# Patient Record
Sex: Female | Born: 1965 | Race: White | Hispanic: No | Marital: Single
Health system: Southern US, Community
[De-identification: ages and names within clinical notes are randomized; demographics above are authoritative.]

---

## 2014-02-04 ENCOUNTER — Inpatient Hospital Stay: Payer: Self-pay | Admitting: Psychiatry

## 2014-02-04 LAB — BEHAVIORAL MEDICINE 1 PANEL
ANION GAP: 7 (ref 7–16)
Albumin: 3.4 g/dL (ref 3.4–5.0)
Alkaline Phosphatase: 97 U/L
BASOS ABS: 0.1 10*3/uL (ref 0.0–0.1)
BILIRUBIN TOTAL: 0.2 mg/dL (ref 0.2–1.0)
BUN: 8 mg/dL (ref 7–18)
Basophil %: 0.4 %
CALCIUM: 8.8 mg/dL (ref 8.5–10.1)
CO2: 32 mmol/L (ref 21–32)
Chloride: 101 mmol/L (ref 98–107)
Creatinine: 0.81 mg/dL (ref 0.60–1.30)
EGFR (Non-African Amer.): >60
EOS ABS: 0.3 10*3/uL (ref 0.0–0.7)
Eosinophil %: 2.2 %
Glucose: 98 mg/dL (ref 65–99)
HCT: 41.5 % (ref 35.0–47.0)
HGB: 13.9 g/dL (ref 12.0–16.0)
Lymphocyte #: 3.5 10*3/uL (ref 1.0–3.6)
Lymphocyte %: 28.5 %
MCH: 30.3 pg (ref 26.0–34.0)
MCHC: 33.4 g/dL (ref 32.0–36.0)
MCV: 91 fL (ref 80–100)
MONO ABS: 1.1 x10 3/mm — AB (ref 0.2–0.9)
MONOS PCT: 8.6 %
NEUTROS ABS: 7.5 10*3/uL — AB (ref 1.4–6.5)
Neutrophil %: 60.3 %
OSMOLALITY: 278 (ref 275–301)
PLATELETS: 213 10*3/uL (ref 150–440)
POTASSIUM: 4 mmol/L (ref 3.5–5.1)
RBC: 4.57 10*6/uL (ref 3.80–5.20)
RDW: 13.2 % (ref 11.5–14.5)
SGOT(AST): 16 U/L (ref 15–37)
SGPT (ALT): 21 U/L (ref 12–78)
Sodium: 140 mmol/L (ref 136–145)
Thyroid Stimulating Horm: 2.04 u[IU]/mL
Total Protein: 7.3 g/dL (ref 6.4–8.2)
WBC: 12.4 10*3/uL — AB (ref 3.6–11.0)

## 2014-02-04 LAB — URINALYSIS, COMPLETE
Bilirubin,UR: NEGATIVE
Blood: NEGATIVE
GLUCOSE, UR: NEGATIVE mg/dL (ref 0–75)
KETONE: NEGATIVE
Leukocyte Esterase: NEGATIVE
NITRITE: NEGATIVE
Ph: 7 (ref 4.5–8.0)
Protein: NEGATIVE
RBC,UR: 3 /HPF (ref 0–5)
Specific Gravity: 1.01 (ref 1.003–1.030)
WBC UR: 1 /HPF (ref 0–5)

## 2014-02-10 ENCOUNTER — Ambulatory Visit: Payer: Self-pay | Admitting: Psychiatry

## 2014-02-26 ENCOUNTER — Ambulatory Visit: Payer: Self-pay | Admitting: Psychiatry

## 2014-11-19 NOTE — Discharge Summary (Signed)
PATIENT NAME:  Linda Newton, Linda Newton MR#:  [id number] DATE OF BIRTH:  19-Dec-1965  DATE OF ADMISSION:  02/04/2014 DATE OF DISCHARGE:  02/10/2014  HOSPITAL COURSE: See dictated history and physical for details of admission. This 49 year old woman with a history of severe recurrent depression was brought here to the hospital for ECT evaluation and treatment. The patient was a good candidate for ECT and was counseled about the procedure and gave full consent. ECT was begun at the beginning of this week and she had treatment on July 13 and 15.  Right unilateral treatment.  Tolerated the treatment well both times with only a modest headache that responded well to appropriate pain medicine. The patient has been interactive on the unit, going to groups, and interacting in therapy. Her affect remained somewhat blunted, but she denies any suicidal ideation and describes himself as feeling more optimistic and hopeful. I have been able to decrease the dose of her benzodiazepines by a bit, although we have not stopped the Klonopin completely. The patient will be discharged home now that we have arranged for transportation. We have manage to contract with a [employer] company that can transport her here for ECT and has promised to do so Friday and then Monday, Wednesday, and Friday into next week. The patient understands that I cannot predict how many ECT treatments will be recommended, but we will work with her. We also discussed the possibility of longer term ECT maintenance if that seems indicated and she is open to consider it. Reviewed medicines.  Reviewed treatment plan. Not acutely dangerous. Discharged today.   DISCHARGE MENTAL STATUS EXAM: Neatly dressed and groomed woman, looks her stated age, cooperative with the interview. Good eye contact. Normal psychomotor activity. Mood is stated as a little bit better. Affect is kind of blunted, but not tearful or terribly depressed. Thoughts are lucid, a little bit slow, but no evidence  of psychotic thinking. Denies auditory or visual hallucinations. Alert and oriented x 4. Good judgment and insight. Short and long-term memory intact.   DISCHARGE MEDICATIONS: Losartan 50 mg once a day, Pregabalin 100 mg 3 times a day, trazodone 300 mg at night, venlafaxine extended release 225 mg once a day, loratadine 10 mg once a day, atorvastatin 10 mg once a day, Saphris 5 mg in the morning and 10 mg at night, clonazepam 0.5 mg twice a day with instructions to not take it on the morning of treatment, Zolpidem 5 mg at night, Amlodipine 2.5 mg once a day, hydrochlorothiazide 12.5 mg once a day, Naprosyn 500 mg twice a day as needed for pain, and docusate 100 mg twice a day.   LABORATORY RESULTS: Work-up was done for ECT and included normal liver function tests,  normal electrolytes,  normal glucose.  There was a slightly elevated white count at 12.5, but otherwise the CBC was all normal. Urinalysis was unremarkable. The EKG was read as normal sinus rhythm with a nonspecific ST and T wave abnormality that did not appear to be ischemic. The QT corrected was 457. A head CT was performed, which showed no focal findings, just mild atrophy. The chest x-ray was unremarkable.   DISPOSITION: Discharge home by [employer].  Follow up with outpatient treatment for ECT on an index basis. Also has an ACT team in the community.   DIAGNOSIS, PRINCIPAL AND PRIMARY:  AXIS I: Major depression, severe, recurrent.   SECONDARY DIAGNOSES:  AXIS I: Posttraumatic stress disorder by history.  AXIS II: Deferred.  AXIS III: Awful sclerosis,  hypertension, history of constipation, chronic mild pain, dyslipidemia.  AXIS IV: Moderate from chronic depression.  AXIS V: Functioning at time of discharge 48.    ____________________________ Audery Amel, MD jtc:ts D: 02/10/2014 11:03:38 ET T: 02/10/2014 12:07:03 ET JOB#: [id number]  cc: Audery Amel, MD, <Dictator> Audery Amel MD ELECTRONICALLY SIGNED 02/13/2014 12:42

## 2014-11-19 NOTE — H&P (Signed)
PATIENT NAME:  Linda Newton, Linda Newton MR#:  [id number] DATE OF BIRTH:  12/22/1965  DATE OF ADMISSION:  02/04/2014  IDENTIFYING INFORMATION AND CHIEF COMPLAINT: A 49 year old woman sent here voluntarily for ECT evaluation.   CHIEF COMPLAINT: "I'Newton depressed."   HISTORY OF PRESENT ILLNESS: Information obtained from the patient and from the referral information. [facility]. The patient states that she has had severe depression that has been going on for years. She has been diagnosed with bipolar disorder, PTSD and borderline personality disorder as well. The last time she was manic was probably about 5 years ago. Her current episode of depression has been going on since last summer. She had a period of remission at that time due to ECT, but it quickly wore off after she stopped the ECT treatment. Her mood feels down and depressed and anxious all the time. She has frequent suicidal thoughts with negative thoughts about herself. She has very little to no enjoyment of anything in her life. She sleeps because she takes her medication, but she feels tired during the day. Appetite is fine. Denies any hallucinations or psychotic symptoms. She is functioning poorly outside the hospital. The patient is currently seeing an outpatient psychiatrist locally and is on antidepressants and antipsychotics. Current medication seems to not be working and she has not shown any improvement. She has had multiple hospitalizations recently because of suicidal ideation.   PAST PSYCHIATRIC HISTORY: The patient states that she has had prior manic episodes, but it has been about 5 years since then. Most of the time she stays depressed and down. Medications at times have been temporarily helpful, but the most helpful treatment she has had so far was ECT, which was done last summer  in [place]. The patient does have a history of suicide attempts multiple times in the past. No history of serious violence. No  history of drug abuse. She denies having a history of psychotic symptoms with her illness.   SOCIAL HISTORY: The patient is disabled, lives with her husband. She has 4 children, but none of them live with her. She says her relationship with her husband is good and she has a lot of faith in him. The patient [place] of [place] away from [place] near [place] but states that she would be willing to make the trip to accommodate ECT.   PAST MEDICAL HISTORY: She says she has multiple sclerosis. She claims her current tremor is her most common symptom and she has not had other major symptoms for a while. She is not currently in any treatment for it and has not seen a neurologist since reloc[place] to [place] . The patient has chronic pain for which she takes Lyrica, high blood pressure, dyslipidemia. No history of heart attack. No history of stroke. No history of coronary artery disease. No history of seizure disorder.   SUBSTANCE ABUSE HISTORY: Denies any alcohol or drug abuse currently or in the past.   FAMILY HISTORY: No known history of mental illness.   CURRENT MEDICATIONS: Norvasc 2.5 mg once a day, Saphris 5 mg in the morning and 10 mg at night, atorvastatin 10 mg once a day, clonazepam 0.5 mg twice a day, docusate 100 mg twice a day, hydrochlorothiazide 12.5 mg once a day, Effexor extended release 225 mg in the morning, Ambien 10 mg at night.   ALLERGIES: Multiple intolerances to medicines including ABILIFY, DEPAKOTE, GEODON, LAMICTAL AND SEROQUEL.   REVIEW OF SYSTEMS: Currently reports being  depressed. Mood is down. Has suicidal thoughts without intent to act on it. Denies hallucinations. Feels jittery most of the time. She denies hallucinations. Physically she feels weak and a little shaky, but denies other acute physical symptoms.   MENTAL STATUS EXAMINATION: alert and oriented, cooperative with the interview, mildly disheveled woman who looks her stated age. Good eye contact. Slow  psychomotor activity. Speech is slow, but easy to understand and complete. Affect blunted and dysphoric. Mood stated as depressed. Thoughts are lucid without loosening of associations but a little bit slowed. Denies any auditory or visual hallucinations. Positive suicidal ideation without intent. No homicidal ideation. Alert and oriented x4. Short and long-term memory intact. Normal intelligence. Normal fund of knowledge.   PHYSICAL EXAMINATION: GENERAL: Slightly overweight woman, looks in no acute distress.  SKIN: No skin lesions identified.  HEENT: Pupils equal and reactive. Face symmetric. She has no teeth but oral mucosa looks intact and healthy.  NECK AND BACK: Nontender.  MUSCULOSKELETAL: Full range of motion in the neck. Full rage of motion at all extremities.  NEUROLOGIC: Normal gait. Strength and reflexes normal and symmetric throughout. Cranial nerves symmetric and normal.  LUNGS: Clear without wheezes.  HEART: Regular rate and rhythm.  ABDOMEN: Soft, nontender. Normal bowel sounds. VITAL SIGNS: We do not yet have a set of vital signs that have been taken on this patient. we are waiting on that.   LABORATORY RESULTS: Nothing back yet since she just came into the hospital.   ASSESSMENT: A 49 year old woman with bipolar disorder depressed with an extended period of depression. No psychotic features but ongoing suicidal ideation. She has had failure to respond to multiple medications and failure to tolerate multiple medications. She has had a good response to ECT in the past. She understands the nature of ECT, the side effects involved and is very agreeable, in fact asking to start it again.   TREATMENT PLAN: Continue current medicines. Cut back a little bit on the Ativan that she has been taking. Involve her in groups and activities on the unit as much as possible. Get the full lab tests, the x-ray, the EKG and I am going to get a head CT done. She will meet with the ECT nurse and she will  be given the option to sign consent. If she does we will plan for ECT treatment, right unilateral, to begin on Monday morning.   DIAGNOSIS, PRINCIPAL AND PRIMARY:  AXIS I: Bipolar disorder type I depressed.   SECONDARY DIAGNOSES: AXIS I: No further.  AXIS II: Deferred.  AXIS III: Multiple sclerosis, high blood pressure, dyslipidemia.  AXIS IV: Severe from chronic illness.  AXIS V: Functioning at time of admission 33.   ____________________________ Audery Amel, MD jtc:sb D: 02/04/2014 15:39:37 ET T: 02/04/2014 15:48:32 ET JOB#: [id number]  cc: Audery Amel, MD, <Dictator> Audery Amel MD ELECTRONICALLY SIGNED 02/13/2014 12:41

## 2015-11-18 IMAGING — CR DG CHEST 2V
1 series · 2 of 2 positions shown · non-contrast
Comparison: None.

CLINICAL DATA: Preprocedure evaluation for electroconvulsive
therapy

EXAM:
CHEST  2 VIEW

[Series 1: w chest pa · 0.14mm/px · 2 of 2 slices shown]
[im 1/2]
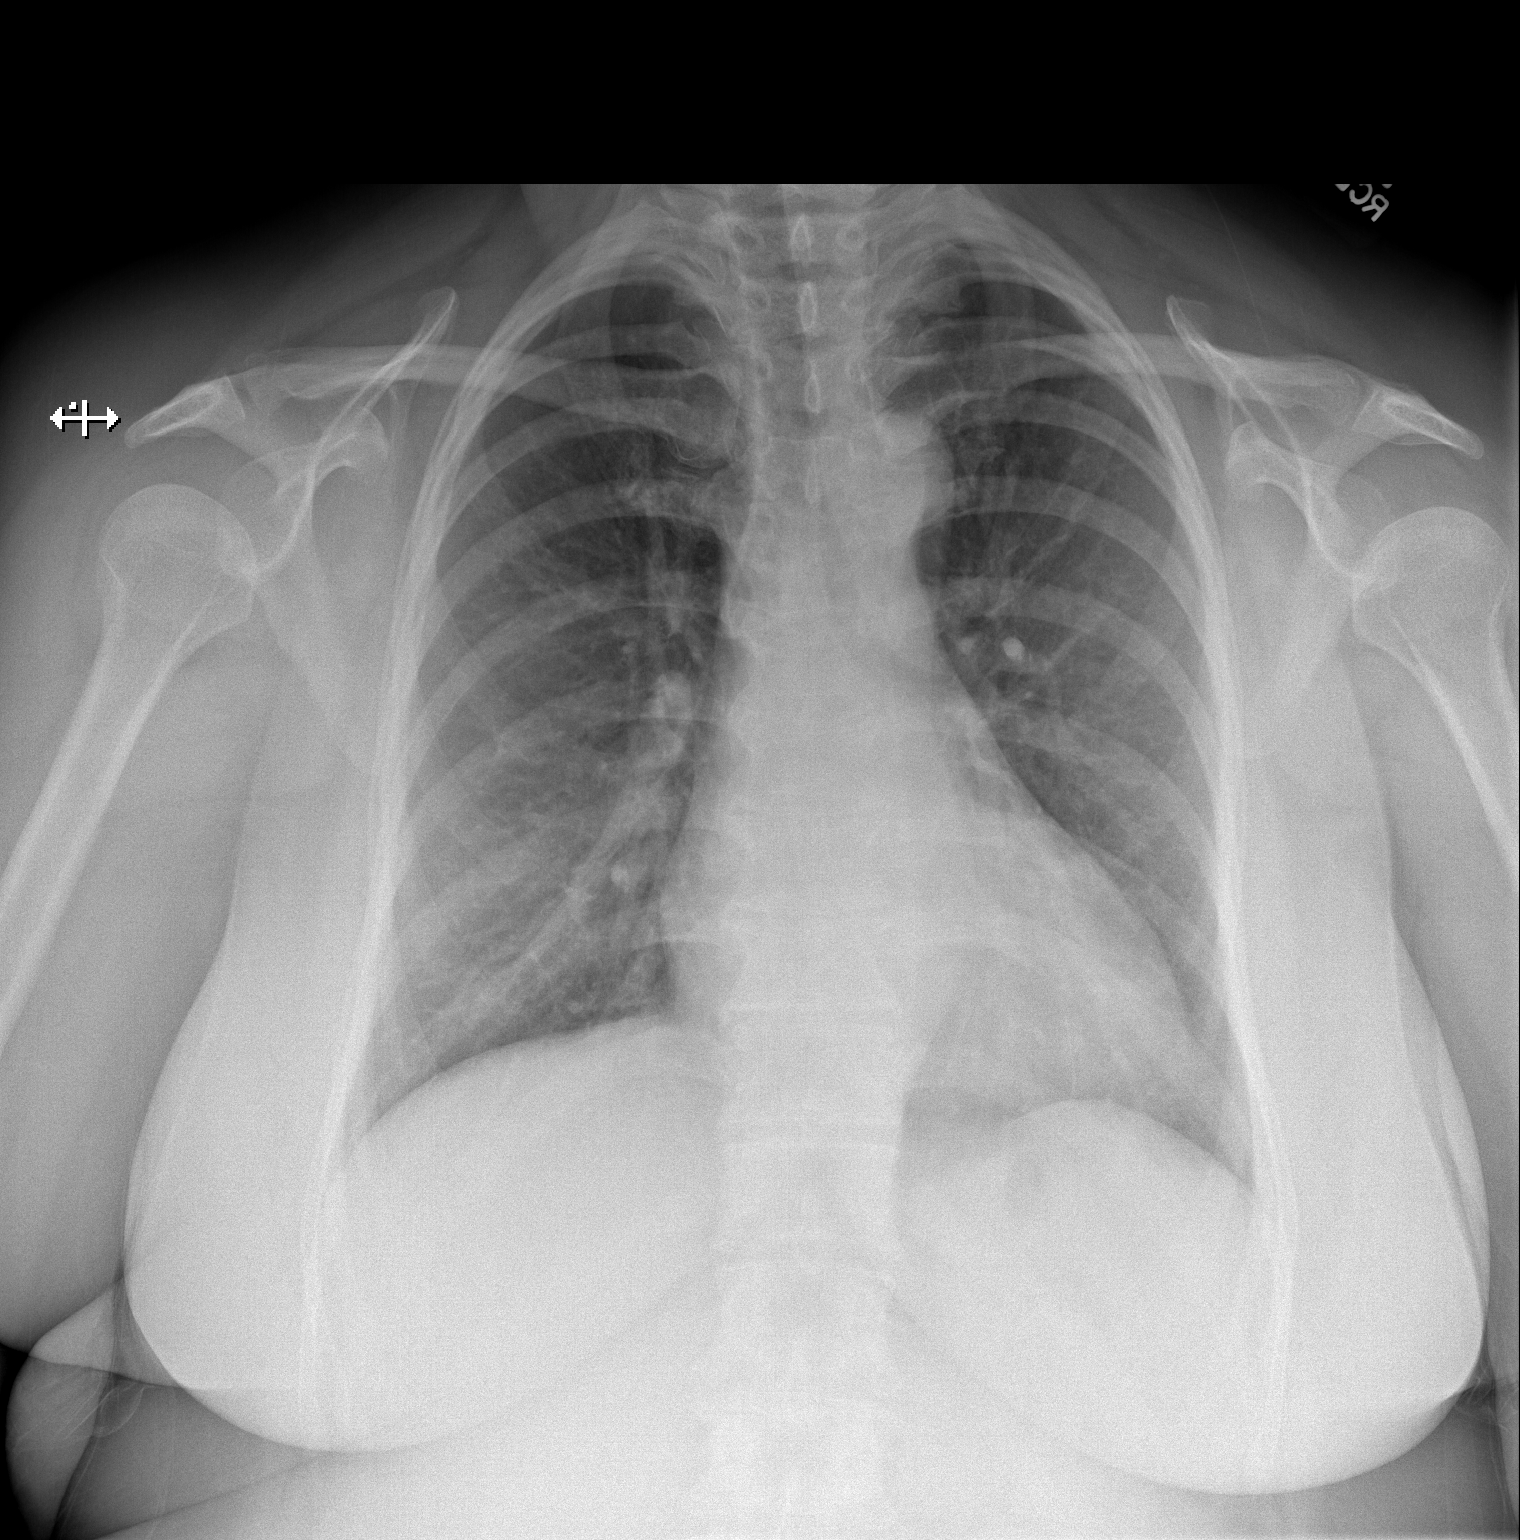
[im 2/2]
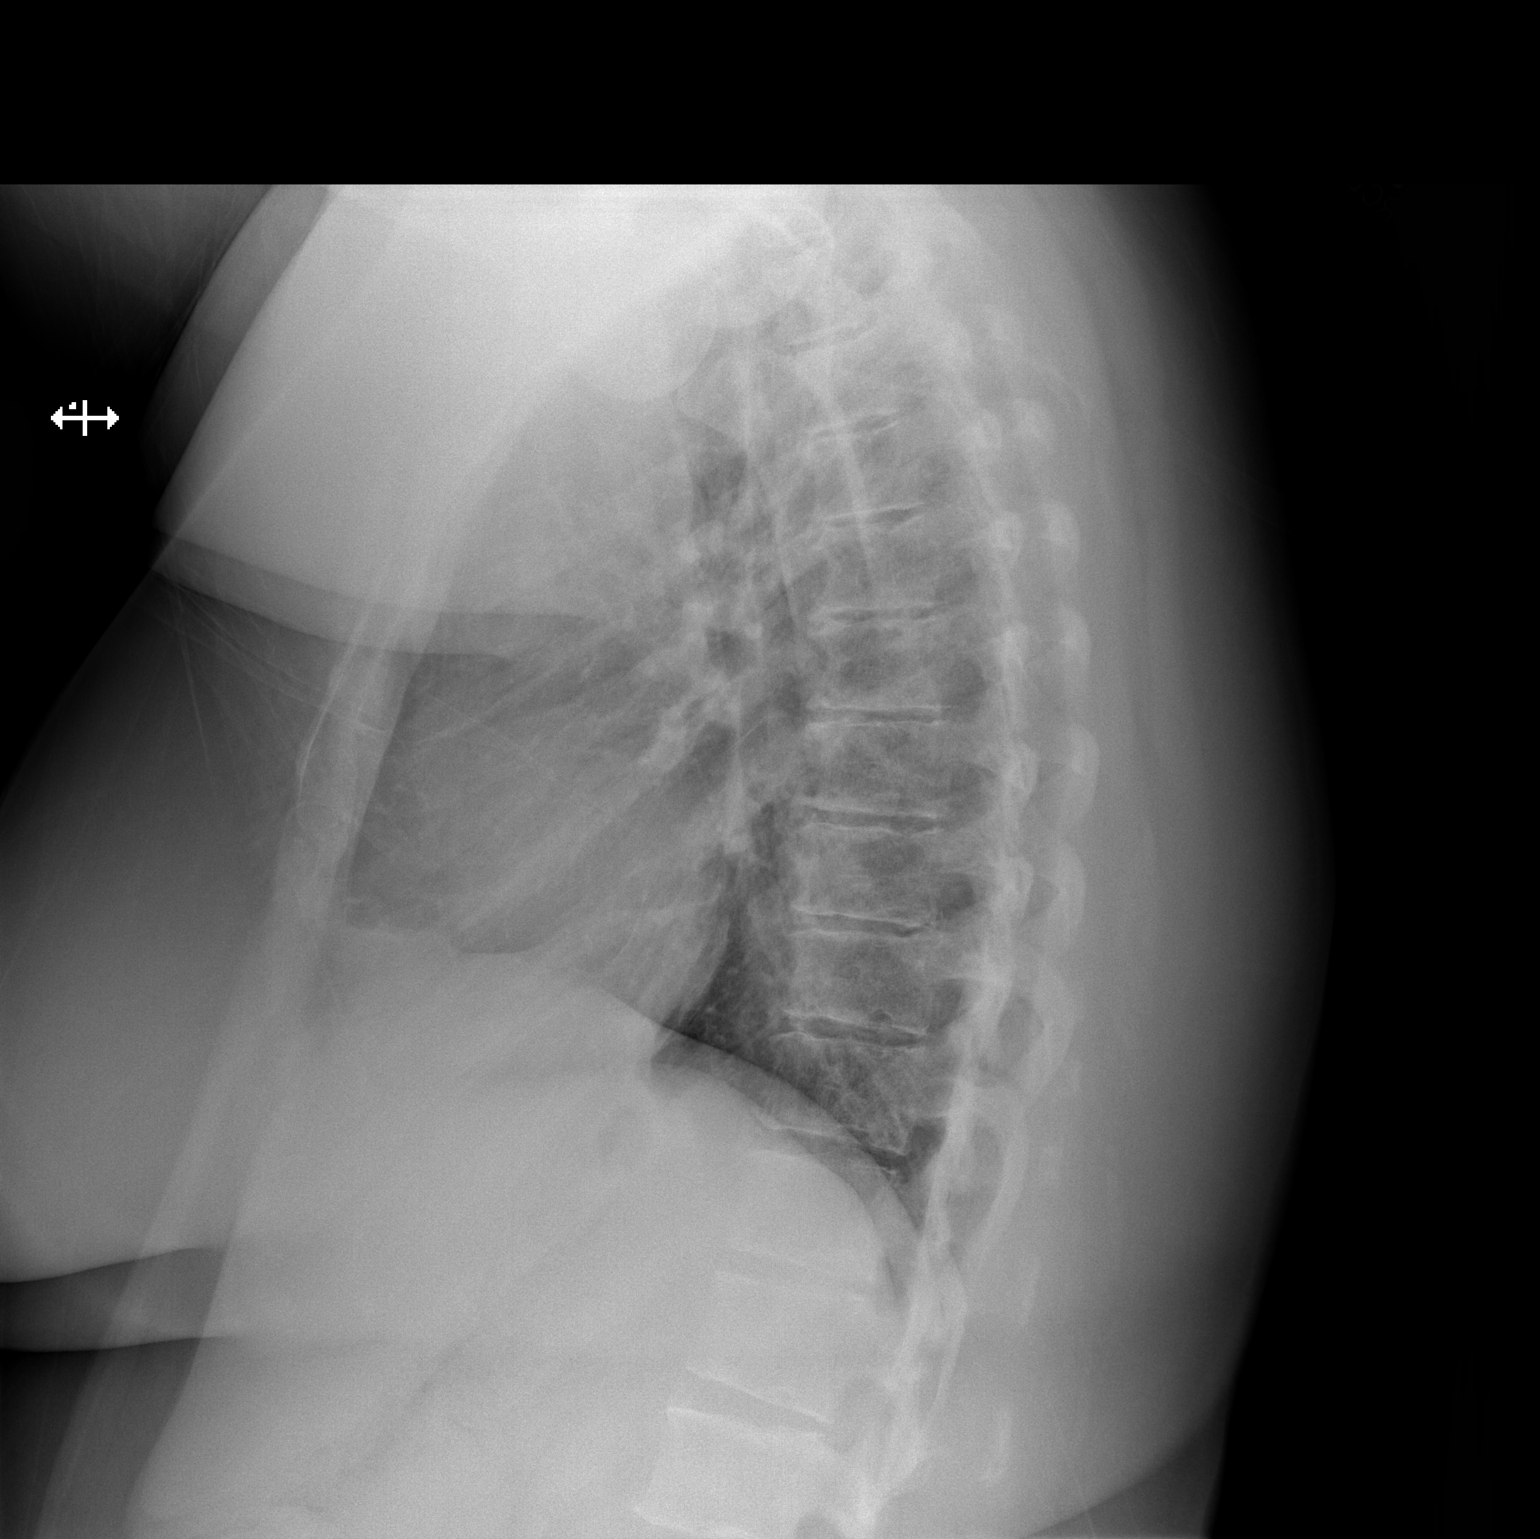

[2 of 2 positions shown; findings below may reference images not displayed]

FINDINGS: Lungs are clear. Heart is upper normal in size with normal pulmonary
vascularity. No adenopathy. No pneumothorax. No bone lesions.
IMPRESSION: No edema or consolidation.

## 2015-11-18 IMAGING — CT CT HEAD WITHOUT CONTRAST
1 series · 16 of 30 positions shown, 20 images · non-contrast
Comparison: None.

CLINICAL DATA: Depression and bipolar disorder. Multiple sclerosis.

EXAM:
CT HEAD WITHOUT CONTRAST
TECHNIQUE: Contiguous axial images were obtained from the base of the skull
through the vertex without intravenous contrast.

[Series 4: head wo · axial · 0.40mm/px · z∈[-160,-30]mm · 16 of 32 slices shown, 20 images]
[im 2/32  brain]
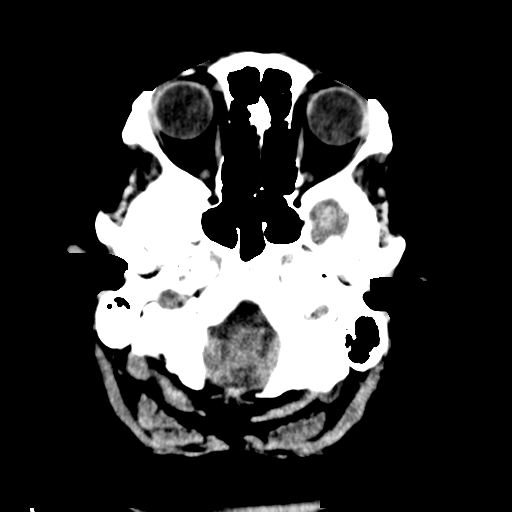
[im 2/32  bone]
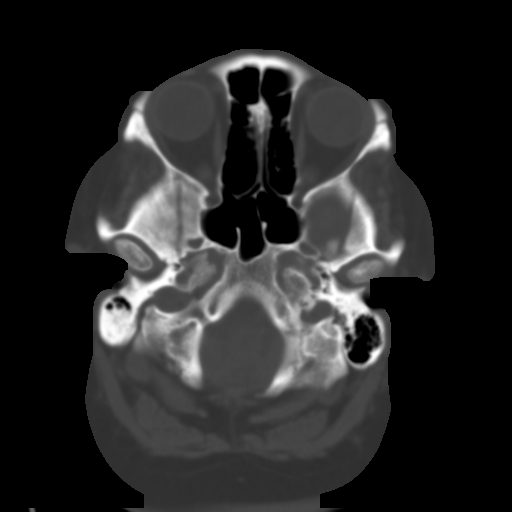
[im 4/32  brain]
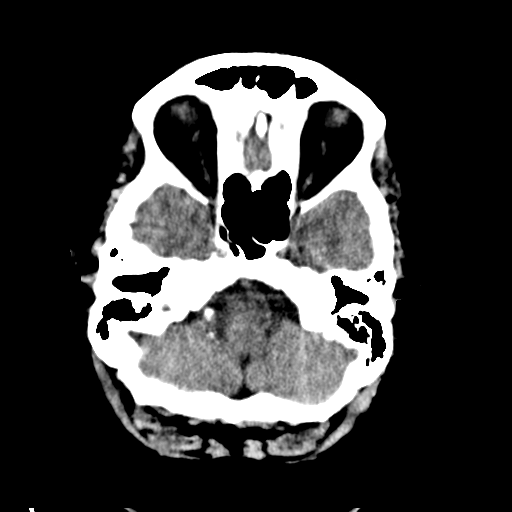
[im 6/32  brain]
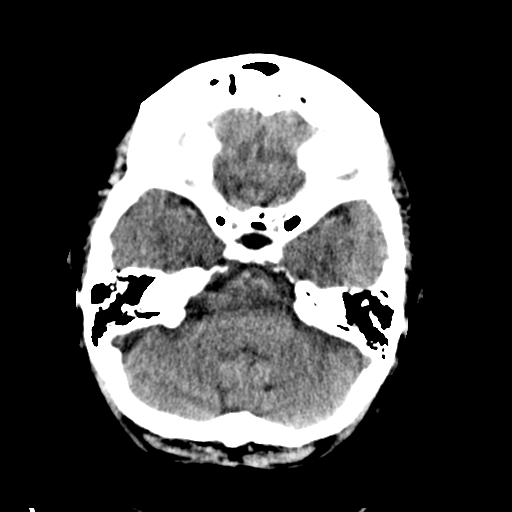
[im 8/32  brain]
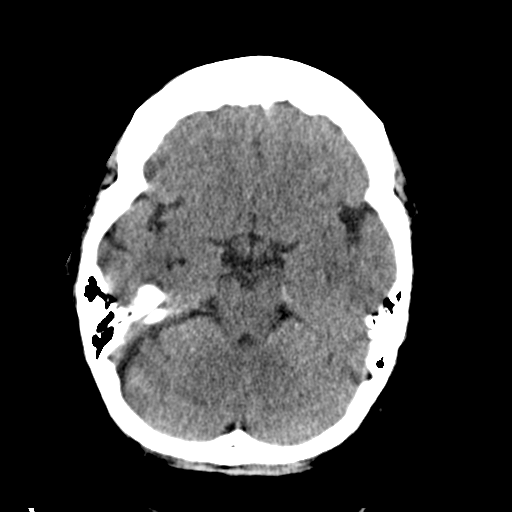
[im 9/32  brain]
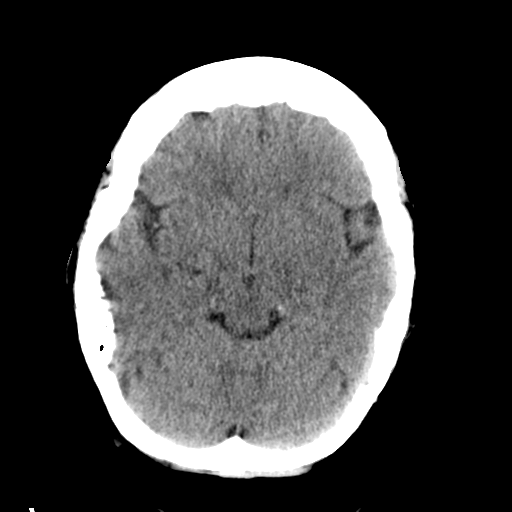
[im 9/32  bone]
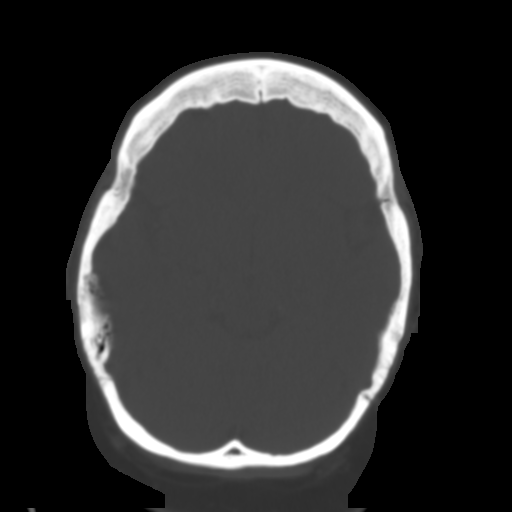
[im 11/32  brain]
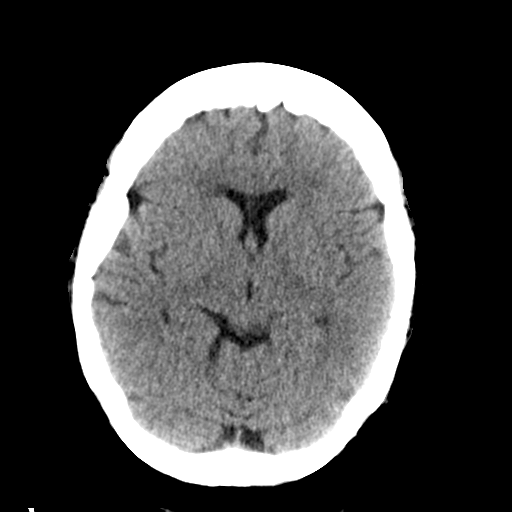
[im 13/32  brain]
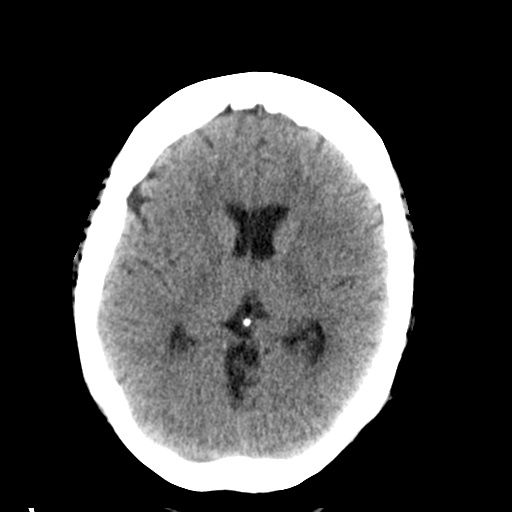
[im 15/32  brain]
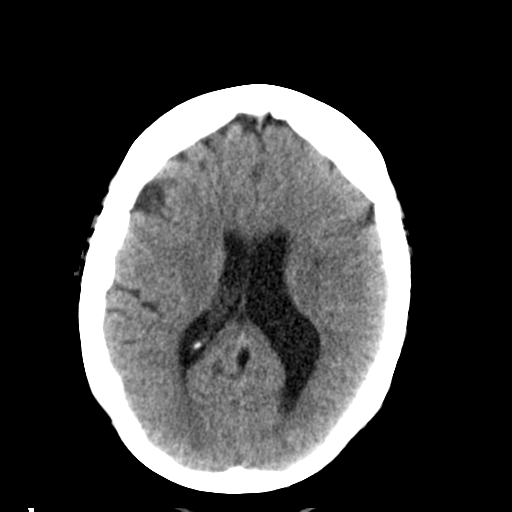
[im 17/32  brain]
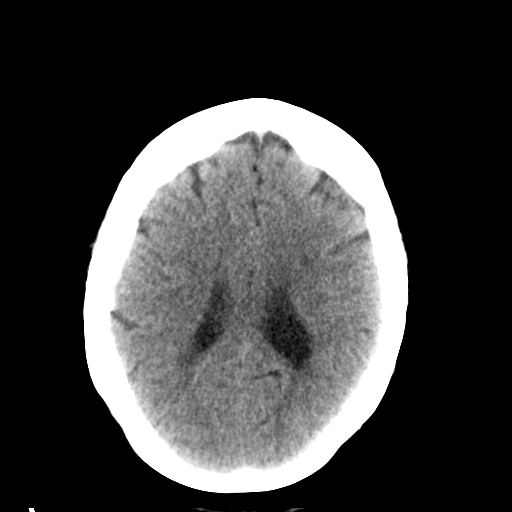
[im 17/32  bone]
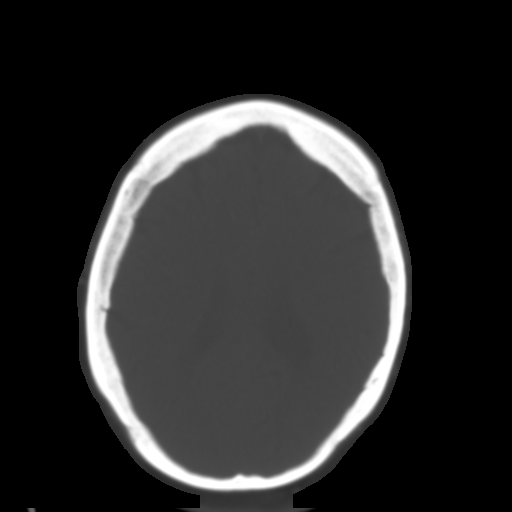
[im 19/32  brain]
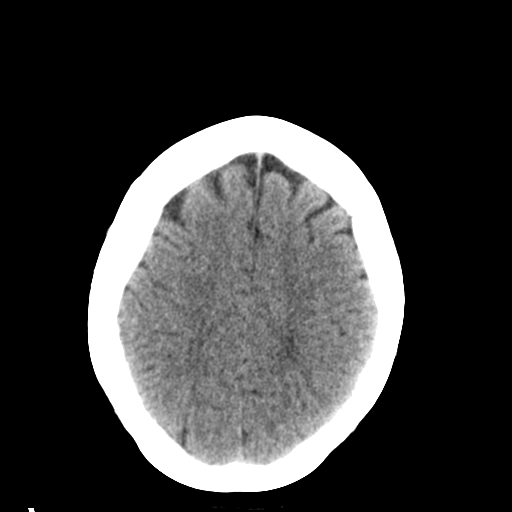
[im 21/32  brain]
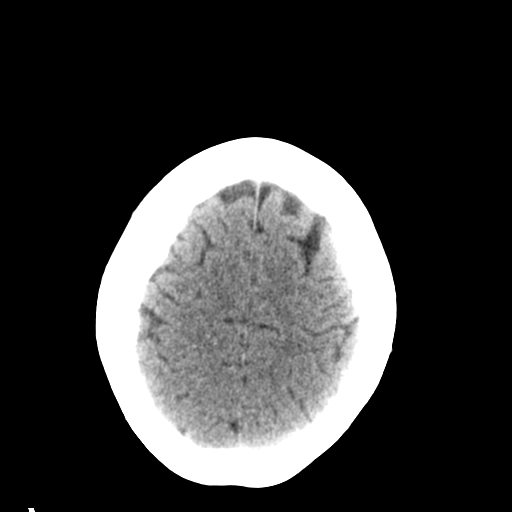
[im 23/32  brain]
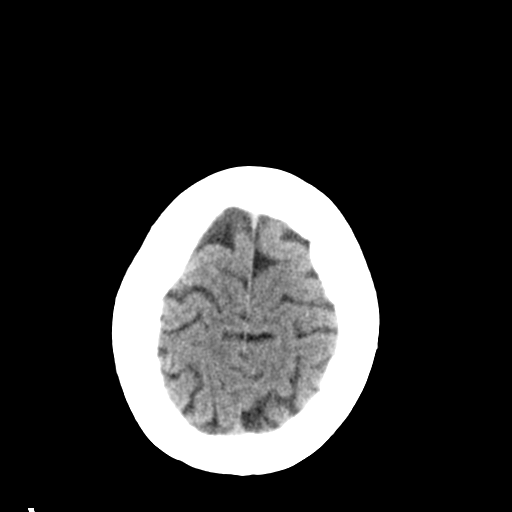
[im 24/32  brain]
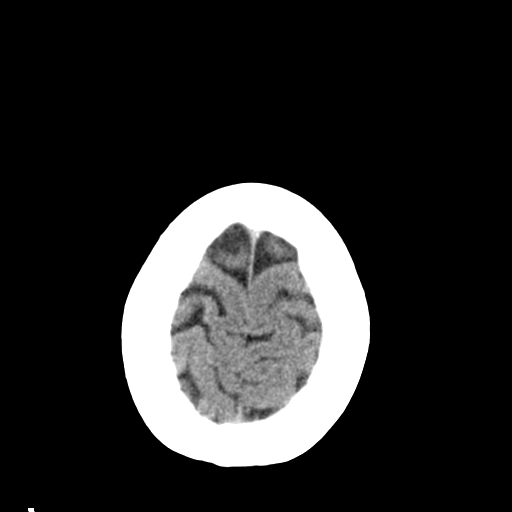
[im 24/32  bone]
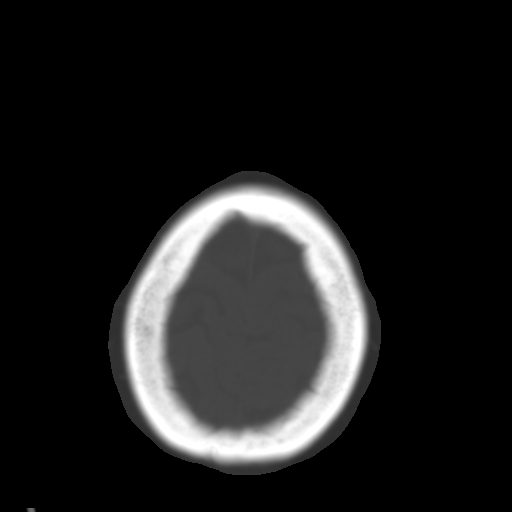
[im 26/32  brain]
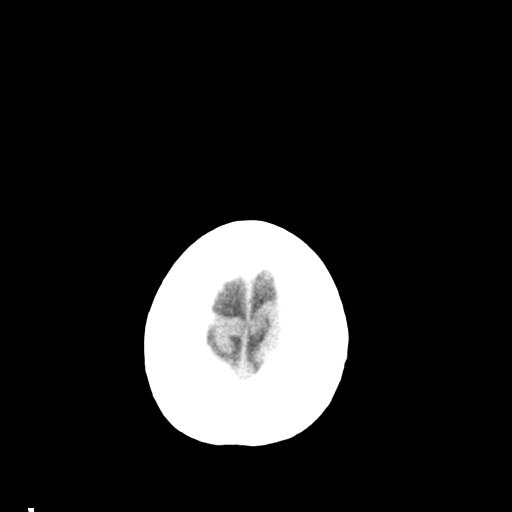
[im 28/32  brain]
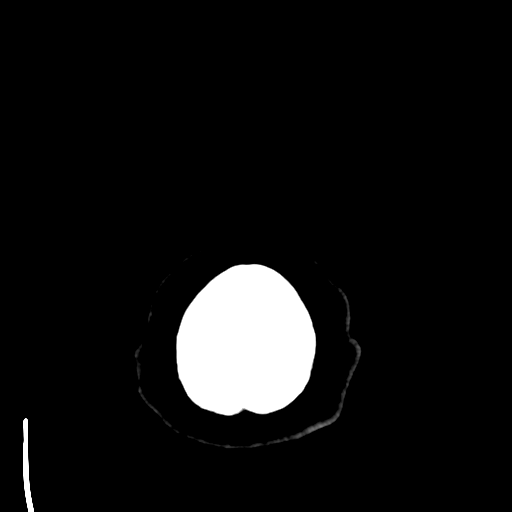
[im 30/32  brain]
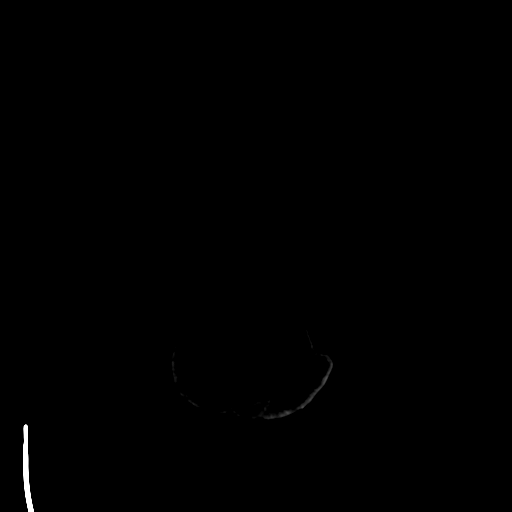

[16 of 30 positions shown; findings below may reference images not displayed]

FINDINGS: There is mild generalized brain atrophy. There are a few areas of
low density in the hemispheric deep white matter consistent with old
small vessel insults. There is no sign of acute infarction, mass
lesion, hemorrhage, hydrocephalus or extra-axial collection. No
significant calvarial abnormality. Visualized sinuses, middle ears
and mastoids are clear.
IMPRESSION: No focal or acute finding. Mild brain atrophy. Few old small vessel
insults within the hemispheric deep white matter.

## 2019-04-29 DEATH — deceased
